# Patient Record
Sex: Male | Born: 1985 | Race: White | Hispanic: No | Marital: Married | Smoking: Former smoker
Health system: Southern US, Community
[De-identification: ages and names within clinical notes are randomized; demographics above are authoritative.]

## PROBLEM LIST (undated history)

## (undated) DIAGNOSIS — I10 Essential (primary) hypertension: Secondary | ICD-10-CM

## (undated) DIAGNOSIS — J9801 Acute bronchospasm: Secondary | ICD-10-CM

## (undated) HISTORY — DX: Essential (primary) hypertension: I10

## (undated) HISTORY — DX: Acute bronchospasm: J98.01

---

## 2016-07-23 ENCOUNTER — Encounter: Payer: Self-pay | Admitting: Pulmonary Disease

## 2016-07-23 ENCOUNTER — Ambulatory Visit (INDEPENDENT_AMBULATORY_CARE_PROVIDER_SITE_OTHER)
Admission: RE | Admit: 2016-07-23 | Discharge: 2016-07-23 | Disposition: A | Discharge status: Home / Self Care | Payer: BLUE CROSS/BLUE SHIELD | Source: Ambulatory Visit | Attending: Pulmonary Disease | Admitting: Pulmonary Disease

## 2016-07-23 ENCOUNTER — Ambulatory Visit (INDEPENDENT_AMBULATORY_CARE_PROVIDER_SITE_OTHER): Payer: BLUE CROSS/BLUE SHIELD | Admitting: Pulmonary Disease

## 2016-07-23 VITALS — BP 130/90 | HR 81 | Ht 70.0 in | Wt 179.8 lb

## 2016-07-23 DIAGNOSIS — R079 Chest pain, unspecified: Secondary | ICD-10-CM

## 2016-07-23 DIAGNOSIS — R0602 Shortness of breath: Secondary | ICD-10-CM

## 2016-07-23 DIAGNOSIS — R9431 Abnormal electrocardiogram [ECG] [EKG]: Secondary | ICD-10-CM | POA: Diagnosis not present

## 2016-07-23 LAB — NITRIC OXIDE: NITRIC OXIDE: 8

## 2016-07-23 NOTE — Patient Instructions (Addendum)
We will schedule you for chest x-ray and pulmonary function tests for further evaluation of your lungs. We will refer you to cardiology for eval of the abnromal EKG  Return in 3 months
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
## 2016-07-25 ENCOUNTER — Telehealth: Payer: Self-pay | Admitting: Internal Medicine

## 2016-07-25 ENCOUNTER — Ambulatory Visit (INDEPENDENT_AMBULATORY_CARE_PROVIDER_SITE_OTHER): Payer: BLUE CROSS/BLUE SHIELD | Admitting: Internal Medicine

## 2016-07-25 ENCOUNTER — Telehealth: Payer: Self-pay | Admitting: Pulmonary Disease

## 2016-07-25 ENCOUNTER — Encounter: Payer: Self-pay | Admitting: Internal Medicine

## 2016-07-25 VITALS — BP 134/82 | HR 64 | Ht 70.0 in | Wt 179.6 lb

## 2016-07-25 DIAGNOSIS — R011 Cardiac murmur, unspecified: Secondary | ICD-10-CM | POA: Diagnosis not present

## 2016-07-25 DIAGNOSIS — Z8249 Family history of ischemic heart disease and other diseases of the circulatory system: Secondary | ICD-10-CM | POA: Diagnosis not present

## 2016-07-25 DIAGNOSIS — Z13220 Encounter for screening for lipoid disorders: Secondary | ICD-10-CM

## 2016-07-25 DIAGNOSIS — I4510 Unspecified right bundle-branch block: Secondary | ICD-10-CM

## 2016-07-25 NOTE — Telephone Encounter (Signed)
Returned the call back to the patient. He was instructed to come back at his convenience but to make sure that he fasted (nothing after midnight) He verbalized his understanding.

## 2016-07-25 NOTE — Telephone Encounter (Signed)
Patient calling in regards to lab work and would like to verify when to have completed

## 2016-07-25 NOTE — Patient Instructions (Addendum)
Your physician recommends that you return for lab work fasting to check cholesterol   Your physician has requested that you have an echocardiogram @ [address]. Echocardiography is a painless test that uses sound waves to create images of your heart. It provides your doctor with information about the size and shape of your heart and how well your heart's chambers and valves are working. This procedure takes approximately one hour. There are no restrictions for this procedure.  Your physician recommends that you schedule a follow-up appointment as needed.

## 2016-07-25 NOTE — Telephone Encounter (Signed)
Pt has dropped off the disc of his cxr today.  This is in the folder for PM and will be placed in his look at cubby.  Will forward to PM and Margie to follow upon.  Thanks
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
## 2016-07-26 ENCOUNTER — Ambulatory Visit (INDEPENDENT_AMBULATORY_CARE_PROVIDER_SITE_OTHER): Payer: BLUE CROSS/BLUE SHIELD | Admitting: Pulmonary Disease

## 2016-07-26 DIAGNOSIS — R0602 Shortness of breath: Secondary | ICD-10-CM

## 2016-07-26 LAB — PULMONARY FUNCTION TEST
DL/VA % PRED: 105 %
DL/VA: 4.93 ml/min/mmHg/L
DLCO COR: 30.61 ml/min/mmHg
DLCO UNC % PRED: 96 %
DLCO cor % pred: 94 %
DLCO unc: 31.2 ml/min/mmHg
FEF 25-75 Post: 3.41 L/sec
FEF 25-75 Pre: 3.79 L/sec
FEF2575-%CHANGE-POST: -10 %
FEF2575-%PRED-POST: 77 %
FEF2575-%PRED-PRE: 85 %
FEV1-%Change-Post: -7 %
FEV1-%PRED-PRE: 90 %
FEV1-%Pred-Post: 83 %
FEV1-Post: 3.72 L
FEV1-Pre: 4.04 L
FEV1FVC-%Change-Post: -10 %
FEV1FVC-%PRED-PRE: 98 %
FEV6-%CHANGE-POST: 2 %
FEV6-%PRED-POST: 95 %
FEV6-%Pred-Pre: 92 %
FEV6-Post: 5.11 L
FEV6-Pre: 4.99 L
FEV6FVC-%Pred-Post: 101 %
FEV6FVC-%Pred-Pre: 101 %
FVC-%Change-Post: 2 %
FVC-%Pred-Post: 93 %
FVC-%Pred-Pre: 91 %
FVC-Post: 5.11 L
FVC-Pre: 4.99 L
POST FEV1/FVC RATIO: 73 %
PRE FEV1/FVC RATIO: 81 %
Post FEV6/FVC ratio: 100 %
Pre FEV6/FVC Ratio: 100 %
RV % pred: 70 %
RV: 1.17 L
TLC % pred: 91 %
TLC: 6.3 L

## 2016-07-26 NOTE — Progress Notes (Signed)
PFT done today. 

## 2016-07-28 NOTE — Telephone Encounter (Signed)
The patient called me to let me know he went to the ER for chest tightness and dyspnea. He was given prednisone and albuterol which helped.  Will send to Dr. Isaiah SergeMannam.

## 2016-07-29 ENCOUNTER — Ambulatory Visit: Payer: BLUE CROSS/BLUE SHIELD | Admitting: Adult Health

## 2016-07-29 NOTE — Telephone Encounter (Signed)
Pt requesting a callback from PM to further discuss chest xray and diagnoses per last OV... Pt contact # [phone]...ert

## 2016-07-29 NOTE — Telephone Encounter (Signed)
PM  Please Advise-  Pt called to see if you reviewed his xray disk that he brought to the office and informed the patient that you will not be in the office this week. Pt wanted to know what the results of his breathing test and xray and what further recommendations do you have

## 2016-07-30 NOTE — Telephone Encounter (Signed)
Called and discussed with patient that his PFTs and CXR that we got are normal. The report of his CXR in ED on 7/8 is read as normal study. I will review the disc with CXR when I get to office next week. [facility] who feels he does not need controller meds and have recommended albuterol PRN He also saw Dr. Rennis GoldenHilty for RBBB and has an echo pending.   Will follow up in clinic Chilton GreathousePraveen Bali Lyn MD

## 2016-08-01 NOTE — Telephone Encounter (Signed)
[facility]ing [facility] to assure PM gets the disk per his request

## 2016-08-02 ENCOUNTER — Other Ambulatory Visit: Payer: Self-pay

## 2016-08-02 ENCOUNTER — Ambulatory Visit (HOSPITAL_COMMUNITY): Payer: BLUE CROSS/BLUE SHIELD | Attending: Cardiovascular Disease

## 2016-08-02 DIAGNOSIS — R011 Cardiac murmur, unspecified: Secondary | ICD-10-CM

## 2016-08-02 DIAGNOSIS — I4510 Unspecified right bundle-branch block: Secondary | ICD-10-CM | POA: Diagnosis not present

## 2016-08-06 LAB — LIPID PANEL
CHOLESTEROL TOTAL: 162 mg/dL (ref 100–199)
Chol/HDL Ratio: 2.7 ratio (ref 0.0–5.0)
HDL: 60 mg/dL (ref >39)
LDL Calculated: 84 mg/dL (ref 0–99)
TRIGLYCERIDES: 90 mg/dL (ref 0–149)
VLDL Cholesterol Cal: 18 mg/dL (ref 5–40)

## 2016-08-07 NOTE — Telephone Encounter (Signed)
Spoke with pt, and made him aware of below message. I attempted to gather additional information to [facility]. Pt then asked if it mattered where he had his CXR. I explained that I would attempt to contact them for his correct records. Pt then asked that we cancel all scheduled appointments with our office, due not wanting any more medical bills. Apt with PM on 10/23/16 has been canceled per pt request. Nothing further needed.

## 2016-08-07 NOTE — Telephone Encounter (Signed)
Margie please advise if disc has been given to PM and can we close this message?  Thanks

## 2016-08-07 NOTE — Telephone Encounter (Signed)
I got a CD of a CXR dated 02/28/11 from [facility] with an x ray. However the x ray is of a 31 year old man with a different name. I think he got the wrong CD. Ask him if he has the proper CD with his x ray.  Thanks Chilton GreathousePraveen Thersea Manfredonia MD [facility] 08/07/2016, 1:43 PM

## 2016-08-09 ENCOUNTER — Emergency Department (HOSPITAL_COMMUNITY)
Admission: EM | Admit: 2016-08-09 | Discharge: 2016-08-09 | Disposition: A | Discharge status: Home / Self Care | Payer: BLUE CROSS/BLUE SHIELD | Attending: Emergency Medicine | Admitting: Emergency Medicine

## 2016-08-09 ENCOUNTER — Emergency Department (HOSPITAL_COMMUNITY): Payer: BLUE CROSS/BLUE SHIELD

## 2016-08-09 ENCOUNTER — Encounter (HOSPITAL_COMMUNITY): Payer: Self-pay | Admitting: Emergency Medicine

## 2016-08-09 ENCOUNTER — Other Ambulatory Visit: Payer: Self-pay

## 2016-08-09 ENCOUNTER — Encounter: Payer: Self-pay | Admitting: Internal Medicine

## 2016-08-09 DIAGNOSIS — Z87891 Personal history of nicotine dependence: Secondary | ICD-10-CM | POA: Diagnosis not present

## 2016-08-09 DIAGNOSIS — I10 Essential (primary) hypertension: Secondary | ICD-10-CM | POA: Diagnosis not present

## 2016-08-09 DIAGNOSIS — R0789 Other chest pain: Secondary | ICD-10-CM | POA: Diagnosis not present

## 2016-08-09 DIAGNOSIS — R079 Chest pain, unspecified: Secondary | ICD-10-CM | POA: Diagnosis present

## 2016-08-09 LAB — CBC
HEMATOCRIT: 42.4 % (ref 39.0–52.0)
HEMOGLOBIN: 14.8 g/dL (ref 13.0–17.0)
MCH: 30.6 pg (ref 26.0–34.0)
MCHC: 34.9 g/dL (ref 30.0–36.0)
MCV: 87.6 fL (ref 78.0–100.0)
Platelets: 259 10*3/uL (ref 150–400)
RBC: 4.84 MIL/uL (ref 4.22–5.81)
RDW: 13.1 % (ref 11.5–15.5)
WBC: 7.8 10*3/uL (ref 4.0–10.5)

## 2016-08-09 LAB — I-STAT TROPONIN, ED: TROPONIN I, POC: 0 ng/mL (ref 0.00–0.08)

## 2016-08-09 LAB — BASIC METABOLIC PANEL
ANION GAP: 9 (ref 5–15)
BUN: 9 mg/dL (ref 6–20)
CALCIUM: 9.3 mg/dL (ref 8.9–10.3)
CO2: 25 mmol/L (ref 22–32)
Chloride: 106 mmol/L (ref 101–111)
Creatinine, Ser: 1 mg/dL (ref 0.61–1.24)
Glucose, Bld: 103 mg/dL — ABNORMAL HIGH (ref 65–99)
POTASSIUM: 3.8 mmol/L (ref 3.5–5.1)
SODIUM: 140 mmol/L (ref 135–145)

## 2016-08-09 LAB — D-DIMER, QUANTITATIVE (NOT AT ARMC)

## 2016-08-09 MED ORDER — IOPAMIDOL (ISOVUE-370) INJECTION 76%
INTRAVENOUS | Status: AC
  Administered 2016-08-09: 53 mL
  Filled 2016-08-09: qty 100

## 2016-08-09 NOTE — ED Provider Notes (Signed)
[facility]-EMERGENCY DEPT Provider Note   CSN: [id number] Arrival date & time: 08/09/16  0149  By signing my name below, I, Diona Browner, attest that this documentation has been prepared under the direction and in the presence of [employer], PA-C. Electronically Signed: Diona Browner, ED Scribe. 08/09/16. 3:53 AM.  History   Chief Complaint Chief Complaint  Patient presents with  . Chest Pain    HPI Patrick Lin is a 31 y.o. male with a PMHx of asthma, HTN and bronchospasm who presents to the Emergency Department complaining of sharp chest pain that started on 08/08/16. Patient remembers lying in bed and rolling over when his pain was exacerbated. Sharp left sided pain started prior to chest pain, but has since subsided spontaneously. No medications taken PTA. Pt notes he hasn't had sharp pain before, but he has had chest discomfort previously for which he has been seen by both cardiology and pulmonology. Patient recently diagnosed with asthma. He was given prednisone which he completed this past Friday. No hx of surgeries or hospitalizations. No FHx of blood clots. Pt denies leg edema, coughing up blood, nausea, and vomiting. He experienced "clammy hands" with his symptoms, but no other diaphoresis. He denies SOB with his chest pain this evening, but did experience some mild SOB earlier today.  HPI  Past Medical History:  Diagnosis Date  . Bronchospasm   . Hypertension     Patient Active Problem List   Diagnosis Date Noted  . Incomplete RBBB 07/25/2016  . Murmur 07/25/2016  . Screening for lipid disorders 07/25/2016  . Family history of heart disease 07/25/2016    History reviewed. No pertinent surgical history.     Home Medications    Prior to Admission medications   Medication Sig Start Date End Date Taking? Authorizing Provider  azelastine (ASTELIN) 0.1 % nasal spray Place 2 sprays into both nostrils 2 (two) times daily. Use in each nostril as directed    [provider]    Family History Family History  Problem Relation Age of Onset  . Multiple sclerosis Mother   . Heart disease Father   . Glaucoma Father   . Breast cancer Maternal Grandmother   . Throat cancer Maternal Grandfather   . Heart disease Paternal Grandfather     Social History Social History  Substance Use Topics  . Smoking status: Former Smoker    Packs/day: 1.00    Years: 9.00    Types: Cigarettes  . Smokeless tobacco: Never Used  . Alcohol use No     Allergies   Penicillins and Sulfa antibiotics   Review of Systems Review of Systems A complete 10 system review of systems was obtained and all systems are negative except as noted in the HPI and PMH.    Physical Exam Updated Vital Signs BP 117/71   Pulse 68   Temp 97.7 F (36.5 C) (Oral)   Resp (!) 9   Ht 5\' 10"  (1.778 m)   Wt 81.6 kg (180 lb)   SpO2 99%   BMI 25.83 kg/m   Physical Exam  Constitutional: He is oriented to person, place, and time. He appears well-developed and well-nourished. No distress.  Anxious appearing  HENT:  Head: Normocephalic and atraumatic.  Eyes: Conjunctivae and EOM are normal. No scleral icterus.  Neck: Normal range of motion.  No JVD  Cardiovascular: Normal rate and regular rhythm.   Pulmonary/Chest: Effort normal. No respiratory distress. He has no wheezes.  Respirations even and unlabored.   Musculoskeletal:  Normal range of motion.  No BLE pitting edema.  Neurological: He is alert and oriented to person, place, and time. He exhibits normal muscle tone. Coordination normal.  GCS 15. Ambulatory with steady gait.  Skin: Skin is warm and dry. No rash noted. He is not diaphoretic. No erythema. No pallor.  Psychiatric: His speech is normal and behavior is normal. His mood appears anxious.  Nursing note and vitals reviewed.    ED Treatments / Results  DIAGNOSTIC STUDIES: Oxygen Saturation is 100% on RA, normal by my interpretation.   COORDINATION OF  CARE: 3:53 AM-Discussed next steps with pt which includes a CT scan. Pt verbalized understanding and is agreeable with the plan.   Labs (all labs ordered are listed, but only abnormal results are displayed) Labs Reviewed  BASIC METABOLIC PANEL - Abnormal; Notable for the following:       Result Value   Glucose, Bld 103 (*)    All other components within normal limits  CBC  D-DIMER, QUANTITATIVE (NOT AT [facility])  I-STAT TROPONIN, ED    EKG  EKG Interpretation None       Radiology Dg Chest 2 View  Result Date: 08/09/2016 CLINICAL DATA:  31 year old male with chest pain. EXAM: CHEST  2 VIEW COMPARISON:  Chest radiograph dated 07/23/2016 FINDINGS: The heart size and mediastinal contours are within normal limits. Both lungs are clear. The visualized skeletal structures are unremarkable. IMPRESSION: No active cardiopulmonary disease. Electronically Signed   By: Elgie Collard M.D.   On: 08/09/2016 02:41   Ct Angio Chest Pe W And/or Wo Contrast  Result Date: 08/09/2016 CLINICAL DATA:  Chest pain and shortness of breath EXAM: CT ANGIOGRAPHY CHEST WITH CONTRAST TECHNIQUE: Multidetector CT imaging of the chest was performed using the standard protocol during bolus administration of intravenous contrast. Multiplanar CT image reconstructions and MIPs were obtained to evaluate the vascular anatomy. CONTRAST:  53 mL Isovue 370 COMPARISON:  Chest radiograph 08/09/2016 FINDINGS: Cardiovascular: Contrast injection is sufficient to demonstrate satisfactory opacification of the pulmonary arteries to the segmental level. There is no pulmonary embolus. The main pulmonary artery is within normal limits for size. There is no CT evidence of acute right heart strain. The visualized aorta is normal. There is a normal 3-vessel arch branching pattern. Heart size is normal, without pericardial effusion. Mediastinum/Nodes: No mediastinal, hilar or axillary lymphadenopathy. The visualized thyroid and thoracic  esophageal course are unremarkable. Lungs/Pleura: No pulmonary nodules or masses. No pleural effusion or pneumothorax. No focal airspace consolidation. No focal pleural abnormality. Upper Abdomen: Contrast bolus timing is not optimized for evaluation of the abdominal organs. Within this limitation, the visualized organs of the upper abdomen are normal. Musculoskeletal: No chest wall abnormality. No acute or significant osseous findings. Review of the MIP images confirms the above findings. IMPRESSION: No pulmonary embolus or other acute thoracic abnormality. Electronically Signed   By: Deatra Robinson M.D.   On: 08/09/2016 05:03    Procedures Procedures (including critical care time)  Medications Ordered in ED Medications  iopamidol (ISOVUE-370) 76 % injection (53 mLs  Contrast Given 08/09/16 0415)     4:10 AM Laboratory results and chest x-ray reviewed with the patient who verbalizes understanding. He states that he wishes to proceed with CT PE study despite low probability of pulmonary embolus. He accepts the radiation risks associated with this study.  5:20 AM CT imaging reviewed with patient. He has been reassured that his symptoms do not appear to be emergent nature. I have encouraged continued  follow-up with his assigned specialists.   Initial Impression / Assessment and Plan / ED Course  I have reviewed the triage vital signs and the nursing notes.  Pertinent labs & imaging results that were available during my care of the patient were reviewed by me and considered in my medical decision making (see chart for details).     31 year old male presents to the emergency department for chest pain which began this evening and resolved spontaneously. No associated shortness of breath. Patient concerned, specifically, about his pain being secondary to a blood clot. He has had similar pain in the past, though pain tonight was described as sharp. He has a history of normal echocardiogram this  month. He is also being followed by pulmonology with recent diagnosis of asthma.  Despite reassuring laboratory workup and chest x-ray, patient requesting chest CT to rule out PE. This was performed after patient verbalized risks of radiation exposure. CT PE study negative for acute embolus. Suspect that degree of pain is due to anxiety. Patient also may have some residual pneumonitis as results of inhaling acidic "fumes" at the beginning of this month. He has been encouraged to continue follow-up with his pulmonologist and cardiologist. Return precautions provided at discharge. Patient agreeable to plan with no unaddressed concerns.   Final Clinical Impressions(s) / ED Diagnoses   Final diagnoses:  Atypical chest pain    New Prescriptions New Prescriptions   No medications on file   I personally performed the services described in this documentation, which was scribed in my presence. The recorded information has been reviewed and is accurate.     Antony MaduraHumes, Shealynn Saulnier, PA-C 08/09/16 [id number]    Glynn Octaveancour, Stephen, MD 08/09/16 [phone]

## 2016-08-09 NOTE — ED Triage Notes (Signed)
Pt endorses central CP and back pain with SOB. Pt has been seen by several pulmonologist and cardiologist and dx with asthma. Pt did search on Internet an believes he has a PE. Pt highly anxious in triage and frustrated bc "no one can figure out what is wrong"

## 2016-10-23 ENCOUNTER — Ambulatory Visit: Payer: BLUE CROSS/BLUE SHIELD | Admitting: Pulmonary Disease

## 2019-05-07 IMAGING — CR DG CHEST 2V
2 series · 2 of 2 positions shown · non-contrast
Comparison: Chest radiograph dated 07/23/2016

CLINICAL DATA: 31-year-old male with chest pain.

EXAM:
CHEST  2 VIEW

[chest pa]
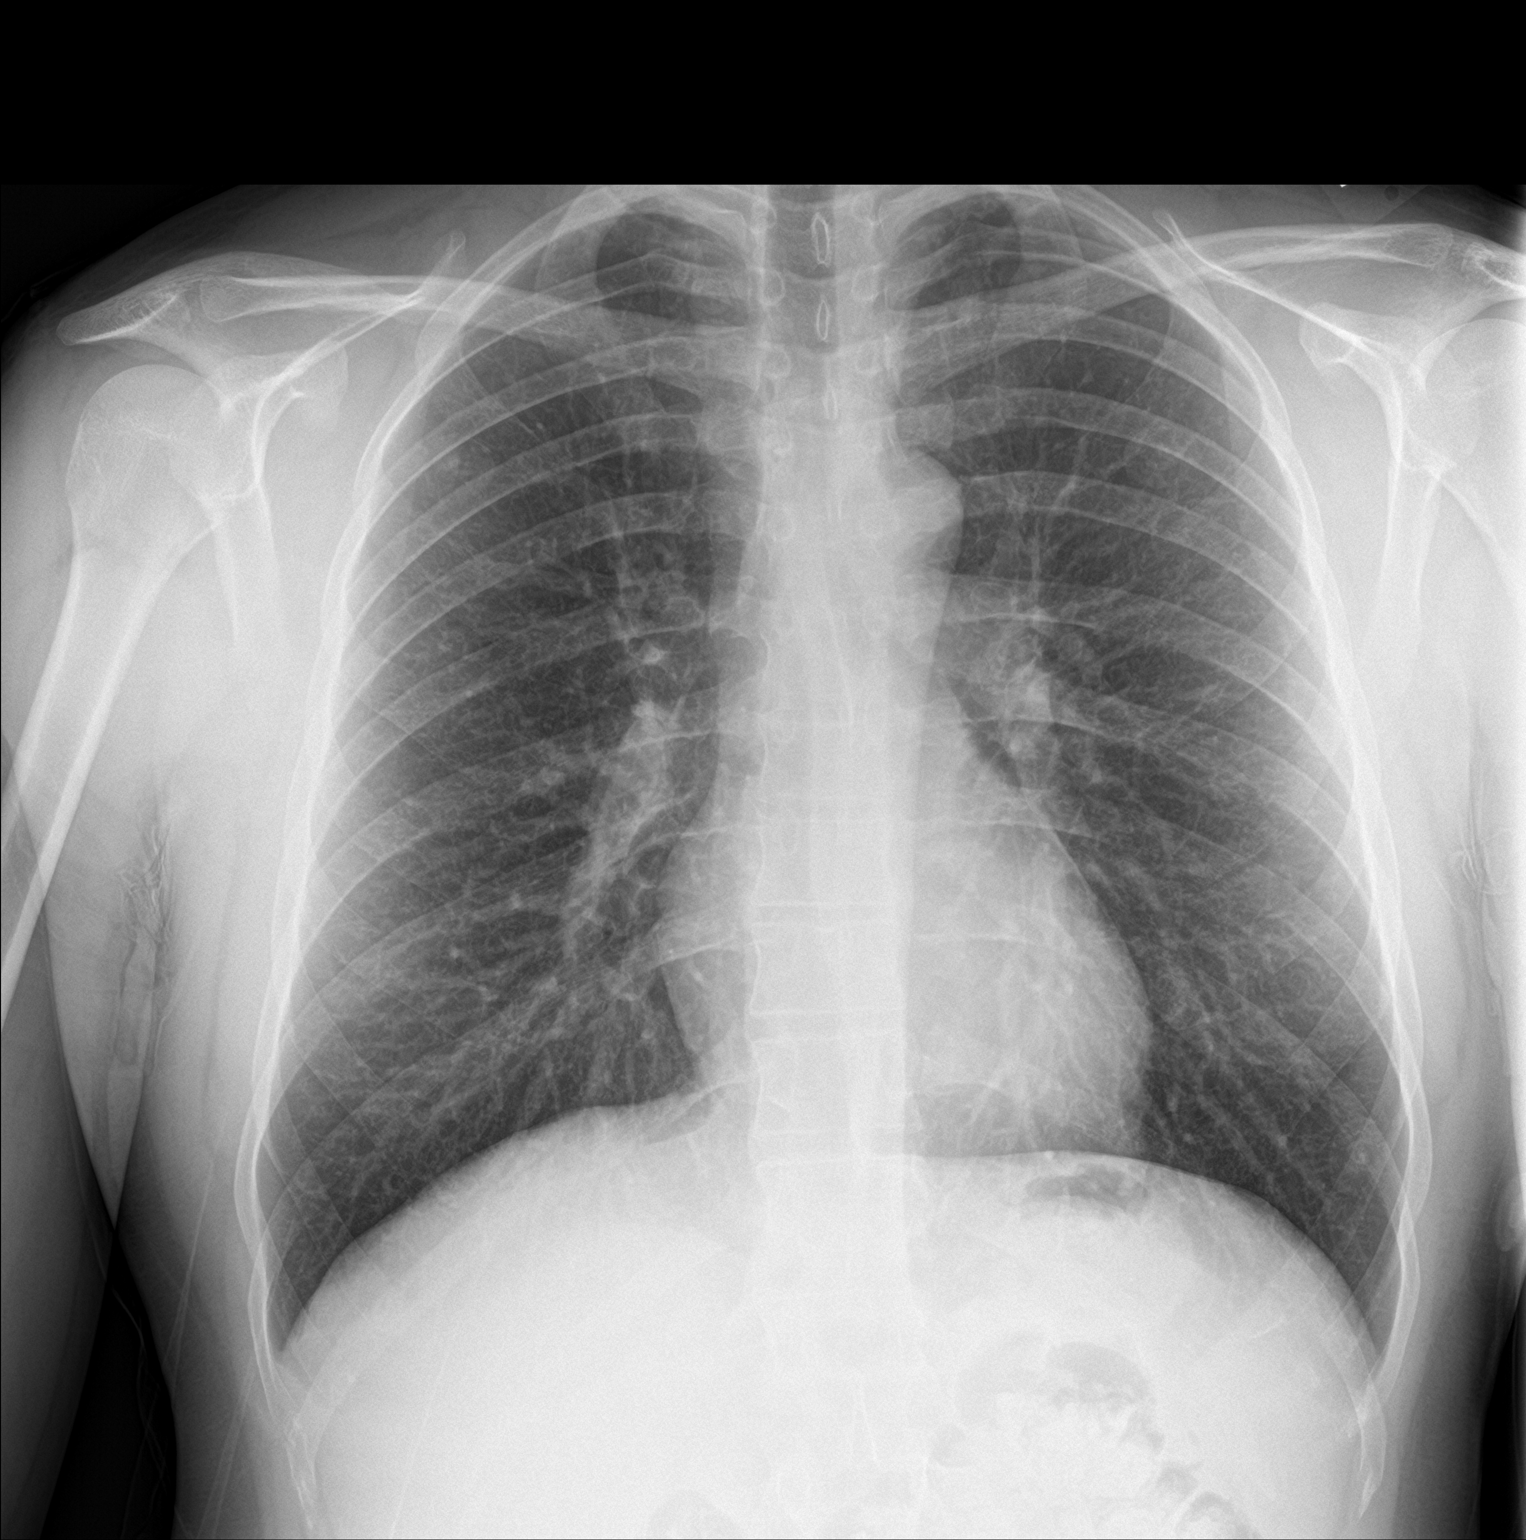

[chest lat]
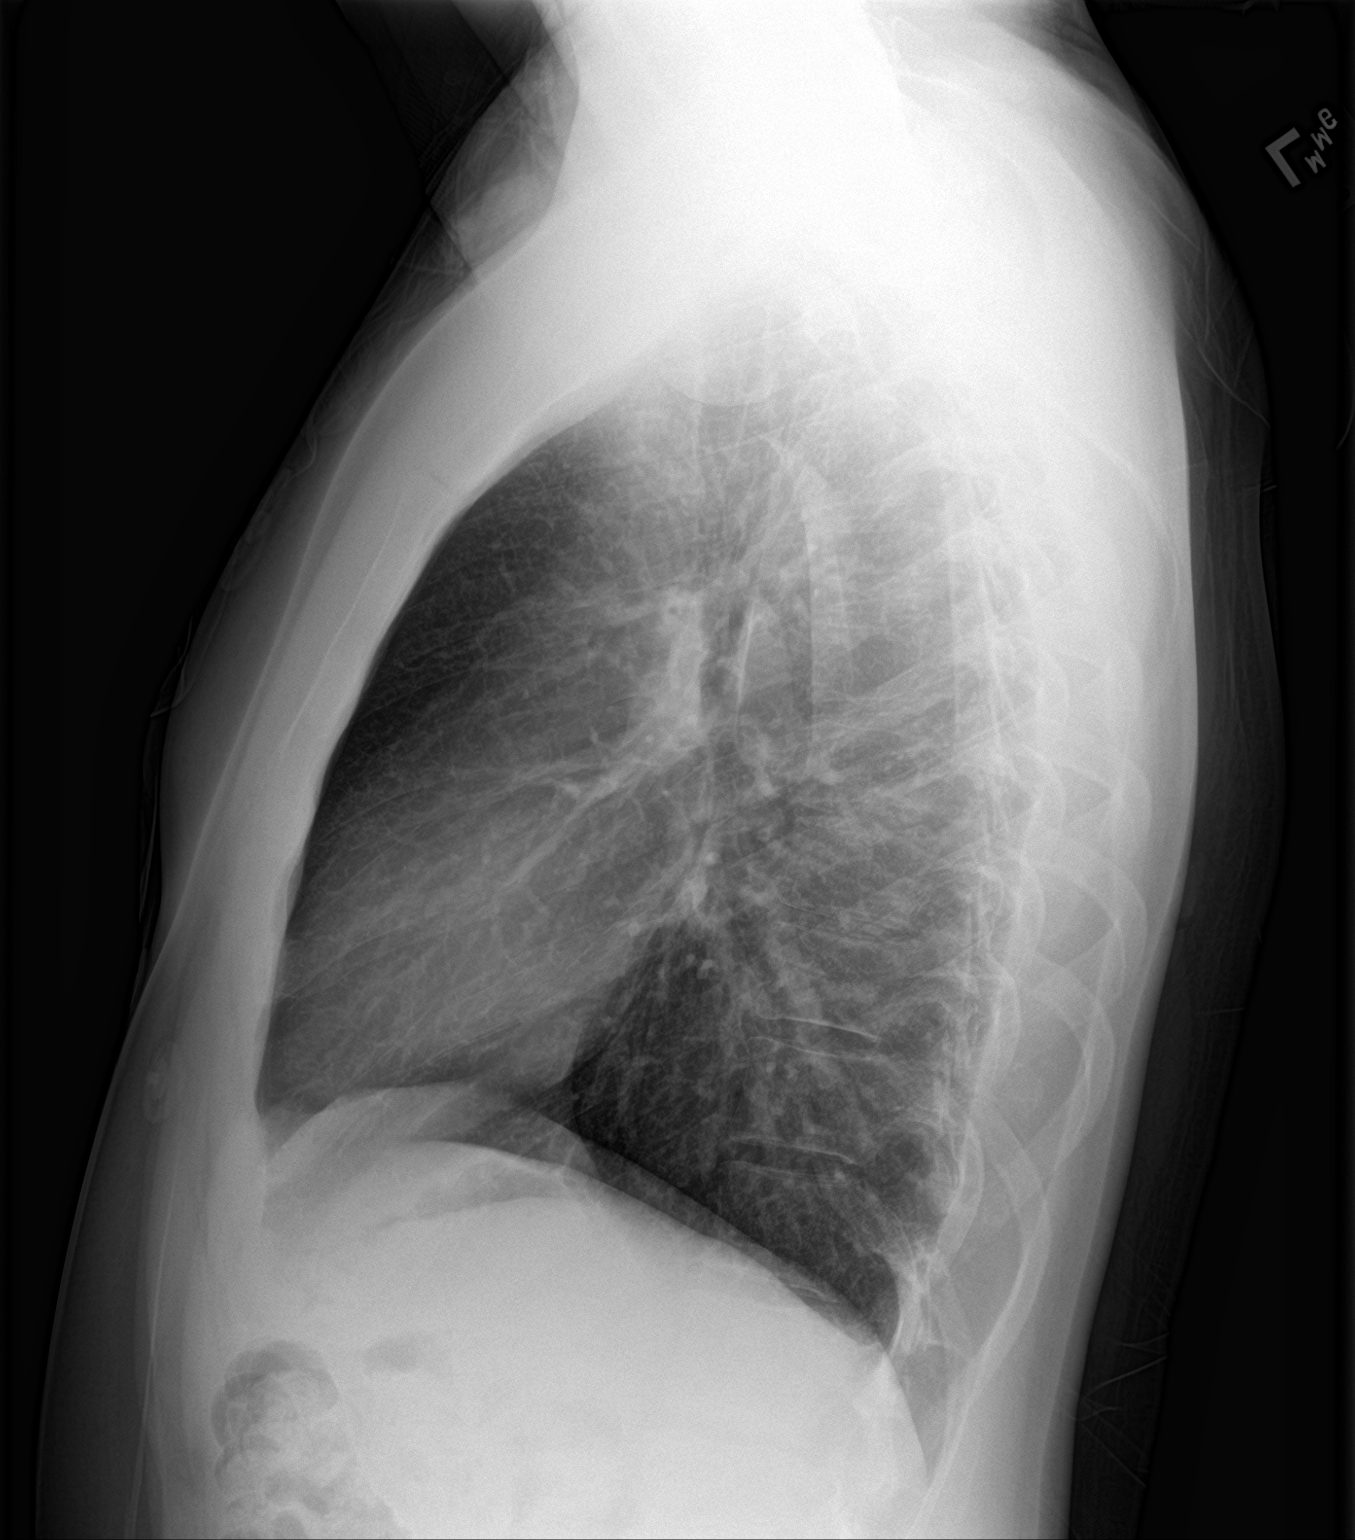

[2 of 2 positions shown; findings below may reference images not displayed]

FINDINGS: The heart size and mediastinal contours are within normal limits.
Both lungs are clear. The visualized skeletal structures are
unremarkable.
IMPRESSION: No active cardiopulmonary disease.
